# Patient Record
Sex: Female | Born: 1960 | Race: White | Hispanic: No | Marital: Married
Health system: Southern US, Community
[De-identification: ages and names within clinical notes are randomized; demographics above are authoritative.]

## PROBLEM LIST (undated history)

## (undated) DIAGNOSIS — Z1231 Encounter for screening mammogram for malignant neoplasm of breast: Secondary | ICD-10-CM

---

## 2008-01-13 ENCOUNTER — Observation Stay (HOSPITAL_COMMUNITY): Admission: RE | Admit: 2008-01-13 | Discharge: 2008-01-13 | Payer: Self-pay

## 2008-01-22 ENCOUNTER — Encounter: Admission: RE | Admit: 2008-01-22 | Discharge: 2008-01-22 | Payer: Self-pay | Admitting: Neurosurgery

## 2008-01-22 ENCOUNTER — Ambulatory Visit: Payer: Self-pay | Admitting: Cardiology

## 2008-01-22 ENCOUNTER — Inpatient Hospital Stay (HOSPITAL_COMMUNITY): Admission: EM | Admit: 2008-01-22 | Discharge: 2008-01-30 | Payer: Self-pay | Admitting: Emergency Medicine

## 2008-01-23 ENCOUNTER — Encounter (INDEPENDENT_AMBULATORY_CARE_PROVIDER_SITE_OTHER): Payer: Self-pay | Admitting: Neurosurgery

## 2008-03-03 ENCOUNTER — Encounter: Admission: RE | Admit: 2008-03-03 | Discharge: 2008-03-03 | Payer: Self-pay | Admitting: Neurosurgery

## 2008-04-15 ENCOUNTER — Ambulatory Visit (HOSPITAL_COMMUNITY): Admission: RE | Admit: 2008-04-15 | Discharge: 2008-04-15 | Payer: Self-pay | Admitting: Neurology

## 2010-07-10 ENCOUNTER — Encounter: Payer: Self-pay | Admitting: Cardiology

## 2010-07-10 ENCOUNTER — Ambulatory Visit (HOSPITAL_COMMUNITY)
Admission: RE | Admit: 2010-07-10 | Discharge: 2010-07-10 | Payer: Self-pay | Source: Home / Self Care | Attending: Neurology | Admitting: Neurology

## 2010-07-10 ENCOUNTER — Ambulatory Visit: Payer: Self-pay

## 2010-07-10 ENCOUNTER — Encounter (INDEPENDENT_AMBULATORY_CARE_PROVIDER_SITE_OTHER): Payer: Self-pay | Admitting: Neurology

## 2010-07-10 DIAGNOSIS — R55 Syncope and collapse: Secondary | ICD-10-CM

## 2010-07-26 ENCOUNTER — Telehealth (INDEPENDENT_AMBULATORY_CARE_PROVIDER_SITE_OTHER): Payer: Self-pay | Admitting: *Deleted

## 2010-08-20 ENCOUNTER — Encounter: Payer: Self-pay | Admitting: Neurosurgery

## 2010-08-31 NOTE — Progress Notes (Signed)
  12 Lead faxed over to [facility] @ [phone] [facility]  July 26, 2010 3:44 PM

## 2010-08-31 NOTE — Assessment & Plan Note (Signed)
Summary: ekg.dx: syncope.{ gna }  Nurse Visit   Orders Added: 1)  EKG w/ Interpretation [93000]

## 2010-12-12 NOTE — Discharge Summary (Signed)
NAMEMarland Kitchen  Cynthia Montoya, Cynthia Montoya              ACCOUNT NO.:  [id number]   MEDICAL RECORD NO.:  [id number]          PATIENT TYPE:  INP   LOCATION:  3018                         FACILITY:  [facility]   PHYSICIAN:  Kathaleen Maser. Pool, M.D.    DATE OF BIRTH:  [birth year]   DATE OF ADMISSION:  01/22/2008  DATE OF DISCHARGE:  01/30/2008                               DISCHARGE SUMMARY   FINAL DIAGNOSES:  1. Status post C6, 7, 8 anterior cervical diskectomy and fusion with      allograft and plating.  2. 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  3. Fibromuscular dysplasia.   HISTORY OF PRESENT ILLNESS:  Cynthia Montoya is a 50 year old female who is  approximately 10 days status post C6, 7, 8, anterior cervical diskectomy  and fusion with allograft and plating.  Postoperatively, the patient had  been doing reasonably well until she has suffered an episode of  vomiting.  Shortly thereafter the patient had increased neck pain and  headache.  She noted the onset of visual changes.  The patient was found  to have a dense left homonymous hemianopia.  She had no other  neurological findings.  CT scanning demonstrated evidence of a right-  sided occipital lobe infarct in the posterior cerebral artery  distribution.  The patient is admitted for further workup.   HOSPITAL COURSE:  The patient was taken to the MRI scanner which did  confirm the presence of a right posterior cerebral artery infarct.  MRA  showed evidence of occlusion of a right vertebral artery at its origin.  Changes were suggestive of fibromuscular dysplasia.  Later cerebral  arteriography demonstrated consistent findings with fibromuscular  dysplasia as well.  The area of arterial dissection and occlusion was  adjacent to the origin of the vertebral artery and reasonably distant  from the operative bed and it was felt to be unlikely that these two  were anyway related.  We are thinking at  this point is that the patient  suffered a spontaneous dissection when she had her episodes of emesis.  The patient currently is doing well.  She was anticoagulated first with  heparin and transitioned on to Coumadin.  At this point, the patient is  tolerating the Coumadin well with a therapeutic level.  Plan is to keep  her on Coumadin for approximately 6 months.  From a standpoint of her  anterior cervical surgery, her neck is healing well.  She had no  evidence of bleeding complications while anticoagulated.  Her left upper  extremity symptoms remained greatly improved from preoperative state.  The patient will be discharged from the hospital.  She will follow up in  our office in 1 week.  She is being monitored closely in the Coumadin  Clinic.           ______________________________  Kathaleen Maser. Pool, M.D.     HAP/MEDQ  D:  02/24/2008  T:  02/25/2008  Job:  [id number]

## 2010-12-12 NOTE — Consult Note (Signed)
NAME:  Cynthia Montoya NO.:  [id number]   MEDICAL RECORD NO.:  [id number]          PATIENT TYPE:  INP   LOCATION:  3314                         FACILITY:  [facility]   PHYSICIAN:  Marlan Palau, M.D.  DATE OF BIRTH:  21-Feb-1961   DATE OF CONSULTATION:  DATE OF DISCHARGE:                                 CONSULTATION   HISTORY OF PRESENT ILLNESS:  Cynthia Montoya is a 50 year old right-  handed white female born Feb 29, 1961, with a history of diabetes  and has had recent cervical spine surgery that was done on January 13, 2008.  This patient has had problems that began about 6 days prior to  this admission when she noted onset of a mild right periorbital headache  and some visual alteration off to the left with spots in the vision and  some blurring of vision off to the left.  The patient continued to have  this visual changes over the last several days.  The patient took some  Vicodin last evening but had some nausea and vomiting, felt faint, it is  not clear she may have blacked out.  After the nausea and vomiting, the  patient noted some right arm pain and right head pain.  The patient also  noted that the vision off the left has changed dramatically.  The  patient was set up for a CT scan of the head today which showed evidence  of an evolving right posterior cerebral artery distribution stroke.  The  patient was brought in to the emergency room for an evaluation.  MRI of  the brain is pending at this time.   PAST MEDICAL HISTORY:  Significant for:  1. Status post cervical spine surgery that was done on January 13, 2008.  2. 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  3. Gallbladder resection.  4. Menstrual migraine in the past.  5. History of Bell palsy.  The patient does not remember which side.  6. Asthma.  7. Diabetes.   MEDICATIONS:  Birth control pills, Singulair, and Zocor 10 mg daily.  The patient has an allergy to morphine and penicillin, does not smoke,  drinks alcohol on occasion.   SOCIAL HISTORY:  The patient is married, lives in the [place] area.  The patient works as a Runner, broadcasting/film/video, has one child who is  alive and well, is married.   FAMILY MEDICAL HISTORY:  The mother is alive with coronary artery  disease status post surgery.  The patient's father died from an MI.  The  patient has two brothers and no sisters, brothers have hypertension.   REVIEW OF SYSTEMS:  Notable for no recent fevers or chills.  The patient  felt hot last evening, however, the patient does note the headache as  above, some neck pain, right arm pain.  Denies any shortness of breath,  chest pains, palpitations of the heart.  Denies any abdominal pain.  Did  have some nausea.  Denies problems controlling bowels or bladder.  Not  sure that she may have had a syncopal event last evening.  Denies any  numbness or weakness on the arms or legs.   PHYSICAL EXAMINATION:  VITALS:  Blood pressure 141/71, heart rate 71,  respiratory rate 18, temperature afebrile.  GENERAL:  This patient is a  fairly well-developed white female who is alert and cooperative at the  time of examination.  HEENT:  Head is atraumatic.  EYES:  Pupils are  equal, round, and reactive to light.  Disks are flat bilaterally.  Mild  ptosis is seen on the right, but again the pupils are symmetric.  NECK:  Supple.  No carotid bruits.  RESPIRATORY:  Clear.  CARDIOVASCULAR:  Reveals a regular rate and rhythm.  No obvious murmurs or rubs noted.  EXTREMITIES:  Without significant edema.  NEUROLOGIC:  Cranial nerves,  as above.  Facial symmetry is present.  The patient has good sensation  of face to pinprick, soft touch bilaterally.  Has good strength to  facial muscles, muscles to head turn, shoulder shrug bilaterally.  Speech is well enunciated not aphasic.  Visual fields show a dense left  homonymous visual field deficit,  but again extraocular movements are  full.  Motor testing reveals 5/5 strength in all fours.  Good symmetric  motor tone is noted throughout.  Sensory testing is intact to pinprick,  soft touch, and vibratory sensation throughout.  The patient has good  finger-nose-finger, heel-to-shin.  Gait was not tested.  Deep tendon  reflexes remain symmetric normal.  No evidence of extinction is noted.  No drift is seen in arms or legs.  NIH stroke scale score is 2.   CT of the head is as above.   IMPRESSION:  1. New onset of a right posterior cerebral artery distribution stroke      with a left homonymous visual field deficit.  2. Recent cervical spine surgery.   Laboratory values so far include a white count of 9.5, hemoglobin of  13.9, hematocrit 40.9, MCV 93.3, platelets of 389.  The rest of the  profile is not available.  The patient has had symptoms involving the  left homonymous visual field for 6 days prior to this admission.  Given  the recent cervical spine surgery, need to consider and rule out the  possibility of a right carotid dissection.  This patient has symmetric  temporal artery pulses, does not have a true Horner syndrome as the  pupils are symmetric but the patient does have ptosis of the right eye  but does have a history of a previous Bell palsy.  The patient's husband  noted problems with some slight ptosis of the right eye prior to the  cervical spine surgery.  We will pursue further workup at this point.  Patient is not a TPA candidate due to duration of deficit and history of  recent surgery.   PLAN:  1. MRI of the brain to be done.  2. MRI angiogram of the intracranial and extracranial vessels.  3. A  2-D echocardiogram.  4. Aspirin therapy for now, may consider IV heparin if the dissection      is present.  We will follow up the patient's clinical course while      in-house.      Marlan Palau, M.D.  Electronically Signed     CKW/MEDQ  D:  01/22/2008   T:  01/23/2008  Job:  [id number]   cc:   Cynthia Montoya, M.D.  Dr. Yetta Flock

## 2010-12-12 NOTE — Op Note (Signed)
NAME:  Cynthia Montoya, Cynthia Montoya              ACCOUNT NO.:  [id number]   MEDICAL RECORD NO.:  [id number]          Montoya TYPE:  INP   LOCATION:  3314                         FACILITY:  [facility]   PHYSICIAN:  Kathaleen Maser. Pool, M.D.    DATE OF BIRTH:  11-04-1960   DATE OF PROCEDURE:  DATE OF DISCHARGE:                               OPERATIVE REPORT   SERVICE:  Neurosurgery.   ADMITTING DIAGNOSIS:  Right posterior cerebral artery acute infarct  secondary to right vertebral artery dissection.   HISTORY OF PRESENT ILLNESS:  Cynthia Montoya is a 50 year old female who is  9 days status post C6-C7 anterior cervical diskectomy and fusion with  allograft contemplating.  Cynthia surgery itself was uncomplicated and  postoperatively Cynthia Montoya has done quite well.  Cynthia Montoya was discharged Cynthia  following morning without difficulty.  At Cynthia time of discharge, Cynthia Montoya was  having no significant neck pain.  Cynthia Montoya was having no upper extremity  symptoms.  Cynthia Montoya neurological exam was intact.  Approximately 3 days  postoperatively, Cynthia Montoya noted that Cynthia Montoya had a reaction after taking  a Vicodin pill for some discomfort.  Cynthia Montoya noted that Cynthia Montoya had symptoms of  lightheadedness and some feeling that Cynthia Montoya was having Cynthia Montoya vision fade in  and out in both eyes.  I discussed Cynthia situation with Cynthia Montoya over  Cynthia phone.  It seemed to be more medication related than anything else.  Cynthia Montoya had no other neurological symptoms at that time and Cynthia Montoya symptoms  cleared over Cynthia next couple of hours.  Following that, Cynthia Montoya  refrained from taking any more Vicodin and had no further episodes.  Cynthia Montoya  was feeling well up until approximately 5:00 p.m. on January 21, 2008.  At  that time, Cynthia Montoya began having some left lower extremity pain,  which also appeared to be unrelated to Cynthia Montoya surgery.  Cynthia Montoya tried  ibuprofen, rest, and positional changes without improvement.  At that  point, Cynthia Montoya decided to take another Vicodin for pain relief.  Cynthia Montoya  had a  later onset of nausea with subsequent vomiting.  Following Cynthia  vomiting, Cynthia Montoya had worsening neck pain with some pain into right  upper extremity with associated headaches, and now Cynthia Montoya also was having  some difficulty with vision off in Cynthia peripheral aspect of Cynthia Montoya left  visual field.  I discussed Cynthia situation with Cynthia Montoya over Cynthia phone  last night.  At that time, my feeling was that Cynthia Montoya was most  likely having a migrainous attack from some type of unusual migraine  variant brought on by Cynthia Vicodin.  Cynthia Montoya's neck pain and right  upper extremity symptoms subsided over Cynthia next hour and half.  Cynthia Montoya  still had some headache and still had some funny visual symptoms, but  overall when I talked to Cynthia Montoya's husband later on that night, Cynthia Montoya  seemed to be feeling better and I once again was attributing this due to  some delayed symptoms following a migraine variant attack.  Today, I saw  Cynthia Montoya in Cynthia office for a routine follow up.  Cynthia Montoya wound is healing well.  There is no swelling around Cynthia incision.  Cynthia Montoya upper extremity pain has  completely resolved and Cynthia Montoya strength and sensation in Cynthia Montoya upper  extremities is normal.  Cynthia Montoya is not currently having any lower extremity  pain, but Cynthia Montoya still stated that Cynthia left leg feels a little funny.  Cynthia Montoya  main complaint was once again that Cynthia Montoya is having trouble seeing  peripherally out of Cynthia Montoya left eye.  On direct visual field testing, it  became apparent that Cynthia Montoya had rather dramatic left visual field deficit.  Cynthia Montoya extraocular movements were full and Cynthia Montoya had no other evidence of  cranial neuropathy or other problem.  I took Cynthia Montoya downstairs from our  office to get an emergent head CT scan.  This demonstrated edema along  Cynthia medial occipital lobe consistent with a distal posterior cerebral  artery infarction.  I informed Cynthia Montoya of this and we brought Cynthia Montoya  over to Cynthia emergency room for further workup.  Workup at that time   demonstrated a vertebral artery dissection on Cynthia right side at Cynthia  origin of Cynthia vertebral artery.  Cynthia Montoya had an MRI scan of Cynthia Montoya brain, which  demonstrates a right-sided posterior cerebral artery distribution  infarct, which is localized to Cynthia medial occipital lobe surface.  Cynthia Montoya  has no evidence of brainstem infarction or other problems.  Cynthia Montoya does  have some reconstitution of flow versus retrograde flow in Cynthia right  vertebral artery.  Flow into Cynthia left vertebral artery is normal.  Flow  into both PICA arteries are normal bilaterally.   CURRENT PHYSICAL EXAM:  Cynthia Montoya is awake and alert.  Cynthia Montoya is oriented and  appropriate.  Examination of Cynthia Montoya visual acuity finds Cynthia Montoya visual acuity  to be intact bilaterally.  Visual field shows a dense left visual field  cut.  Extraocular movements were full bilaterally.  Facial movement and  sensation is normal bilaterally.  Tongue protrudes in Cynthia midline.  Palate elevates Cynthia midline.  Shoulder shrug equally.  Motor and sensory  examination of Cynthia extremities is normal.  Deep tendon reflexes are  normoactive.  There was also long-tract signs.  Cynthia Montoya gait and Cynthia Montoya posture  is normal.  Cynthia Montoya cervical wound is well healed.  There is no evidence of  swelling.  Cynthia Montoya chest is clear to auscultation.  Heart is regular rate  and rhythm without bruit.  There is no evidence of carotid bruit.  Cynthia Montoya  abdomen is benign.  Extremities are free of injury or deformity.   IMPRESSION:  Acute right posterior cerebral artery infarction secondary  to embolic event secondary to right vertebral artery dissection.  Plan  is for admission Cynthia hospital with anticoagulation via IV heparin with  close observation.  I have discussed Cynthia risks and benefits of  anticoagulating Cynthia Montoya.  This relatively close to Cynthia time of Cynthia Montoya  surgery.  They are aware of Cynthia risk of possible hemorrhage, but  understand Cynthia rationale behind proceeding with this therapy in hopes of  preventing further stroke.   I have also explained to Cynthia Montoya that  Cynthia Montoya is not a candidate now nor was Cynthia Montoya a candidate last night for any  type of thrombolytic therapy because of Cynthia Montoya recent surgery,  however.   With regard to cause of this dissection, it is certainly unclear.  Certainly, there appears to be a temporal relationship to Cynthia surgery as  there was some degree of geographic proximity to Cynthia surgical site.  Cynthia  things that do not make sense to me are;  1. Cynthia dissection itself is inferior and lateral to Cynthia operative site      and in a location were it is unlikely to have any type of presence      or retraction.  2. There was no evidence of any direct injury to Cynthia vertebral artery      during surgery either in Cynthia form of bleeding or direct      visualization.  3. Cynthia Montoya symptoms came on very late and seemed to be more temporal      related to Cynthia vomiting after taking Vicodin.  Certainly, this by      no means denies that this event is somehow related temporally to      Cynthia surgery, but in terms of any direct injury to Cynthia vertebral      artery, I cannot describe it or explain it.  I think it is possible      this is spontaneous dissection that it is coincidentally related to      Cynthia time of Cynthia Montoya surgery.  Certainly this is unusual, but Cynthia Montoya whole      case and presentation has been very unusual.           ______________________________  Kathaleen Maser. Pool, M.D.     HAP/MEDQ  D:  01/22/2008  T:  01/23/2008  Job:  [id number]

## 2010-12-12 NOTE — Op Note (Signed)
NAME:  Cynthia Montoya, Cynthia Montoya              ACCOUNT NO.:  [id number]   MEDICAL RECORD NO.:  [id number]          PATIENT TYPE:  OBV   LOCATION:  3533                         FACILITY:  [facility]   PHYSICIAN:  Kathaleen Maser. Pool, M.D.    DATE OF BIRTH:  1960/12/17   DATE OF PROCEDURE:  01/13/2008  DATE OF DISCHARGE:  01/13/2008                               OPERATIVE REPORT   SERVICE:  Neurosurgery.   PREOPERATIVE DIAGNOSES:  1. 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  2. Status post C5-C6 anterior cervical diskectomy and fusion with      allograft and anterior plating.   POSTOPERATIVE DIAGNOSES:  1. 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  2. Status post C5-C6 anterior cervical diskectomy and fusion with      allograft and anterior plating.   PROCEDURE NAME:  1. Re-exploration of C5-C6 anterior cervical fusion with removal of C5-      C6 anterior plate instrumentation.  2. C6-C7 anterior cervical diskectomy and fusion with allograft and      anterior plating.   SURGEON:  Kathaleen Maser. Pool, MD   ASSISTANT:  Reinaldo Meeker, MD   ANESTHESIA:  General endotracheal.   INDICATION:  Cynthia Montoya is a 50 year old female with history of  previous C5-C6 anterior cervical diskectomy and fusion.  She presents  now with neck and left upper extremity pain, paresthesias, and weakness  consistent with a left-sided C7 radiculopathy failing conservative  management.  Workup demonstrates evidence of a large left-sided C6-C7  disk herniation.  Fusion at C5-C6 appears solid.  We discussed the  options of management including possibility of undergoing a C6-C7  anterior cervical diskectomy and fusion with allograft and anterior  plating.  The patient is aware of the risks and benefits and wished to  proceed.  This will require re-exploration and removal of her C5-C6  instrumentation.   OPERATIVE NOTE:  The patient was brought to the operating room and  placed on the operating  table in supine position.  After an adequate  level of anesthesia was achieved, the patient was placed supine with  neck slightly extended up with Halter traction.  The patient's anterior  cervical region was prepped and draped sterilely.  A #10 blade was used  to make a curvilinear skin incision overlying the C6-C7 interspace.  This was carried down sharply to the platysma and then divided  vertically and dissection proceeded on the medial border of the  sternomastoid muscle and carotid sheath.  Trachea and esophagus were  mobilized tracked towards the left.  Prevertebral fascia stripped off  the anterior spinal column.  Longus colli muscles were then elevated  bilaterally using electrocautery.  Deep self-retainer was placed.  Intraoperative fluoroscopy was used and levels were confirmed.  The  previous plate instrumentation at C5-C6 was dissected free.  The Zephir  plate was disassembled and removed completely.  The fusion at C5-C6 was  inspected and explored and found to be solid.  Attention was then placed  back to C6-C7.  Disk space was then incised with a 15 blade in  rectangular fashion.  Wide disk space clean-out was achieved using  pituitary rongeurs, upbiting pituitary rongeurs, forward and backward,  and Carlen curettes, Kerrison rongeurs, high-speed drill.  Elements of  the disk were removed down towards the posterior annulus.  Microscope  was then brought into the field and used throughout the remainder of  diskectomy.  Remaining aspects of annulus and osteophytes were removed  using high-speed drill down to the level of the posterior longitudinal  ligament.  Posterior longitudinal ligament was then elevated and  resected in piecemeal fashion using Kerrison rongeurs.  The thecal sac  was identified.  A wide central decompression was then performed  undercutting the bodies of C6 and C7.  Decompression then proceeded out  to each neural foramen.  Wide anterior foraminotomies  were then  performed along the course exiting C7 nerve roots bilaterally.  Elements  of disk herniation were completely resected.  All evidence of any  compression was completely eradicated.  At this point, a very thorough  decompression was then achieved.  There is no other injury to the thecal  sac or nerve roots.  A 5-mm LifeNet allograft wedge was then packed into  place and recessed less than roughly 1 mm from the anterior cortical  margin of C6 and C7.  A 25-mm Atlantis anterior cervical plate was then  placed over the C6 and C7 levels.  This was then attached under  fluoroscopic guidance using 30 mL variable screws to each of both  levels.  All 4 screws were given final tightening and found to be solid  within bone.  The locking screws were engaged in both levels.  Final  images revealed good position of the bone grafts and hardware with  proper operative level and normal alignment of spine.  Wound was then  irrigated with antibiotic solution.  Gelfoam was placed topically for  hemostasis which was found to be good.  Microscope and retractor systems  were removed.  Hemostasis was achieved with electrocautery.  Wounds were  closed in layers.  Steri-Strips and sterile dressing were applied.  There were no complications.  The patient tolerated the procedure well  and she returned to recovery room postoperative.           ______________________________  Kathaleen Maser. Pool, M.D.     HAP/MEDQ  D:  01/13/2008  T:  01/14/2008  Job:  [id number]

## 2011-04-26 LAB — HEMOGLOBIN A1C
Hgb A1c MFr Bld: 5
Mean Plasma Glucose: 101

## 2011-04-26 LAB — CBC
HCT: 41.8
HCT: 35.2 — ABNORMAL LOW
HCT: 37.3
HCT: 37.3
HCT: 38.5
HCT: 40.9
HCT: 37.8
HCT: 38
Hemoglobin: 14.2
Hemoglobin: 12.1
Hemoglobin: 12.2
Hemoglobin: 13.1
Hemoglobin: 13.1
Hemoglobin: 13.4
Hemoglobin: 12.3
Hemoglobin: 13.1
Hemoglobin: 13.3
Hemoglobin: 13.7
MCHC: 33.9
MCHC: 34.4
MCHC: 34.7
MCHC: 34.2
MCHC: 34.8
MCHC: 34.9
MCV: 93.1
MCV: 91.7
MCV: 91.9
MCV: 91.9
MCV: 91.9
MCV: 92.4
MCV: 93.3
MCV: 91.5
MCV: 92.2
Platelets: 319
Platelets: 343
Platelets: 389
RBC: 3.83 — ABNORMAL LOW
RBC: 3.86 — ABNORMAL LOW
RBC: 4.05
RBC: 4.06
RBC: 4.13
RBC: 4.17
RBC: 3.85 — ABNORMAL LOW
RBC: 4.13
RBC: 4.13
RBC: 4.35
RDW: 13
RDW: 13
WBC: 5.4
WBC: 6.1
WBC: 6.2
WBC: 6.8
WBC: 9.5
WBC: 4.6
WBC: 5.7
WBC: 6
WBC: 6.4

## 2011-04-26 LAB — BASIC METABOLIC PANEL
BUN: 9
BUN: 9
CO2: 26
CO2: 23
Chloride: 105
Glucose, Bld: 90
Glucose, Bld: 82
Potassium: 4.3
Potassium: 3.9
Sodium: 140

## 2011-04-26 LAB — DIFFERENTIAL
Basophils Absolute: 0
Basophils Relative: 0
Eosinophils Absolute: 0
Eosinophils Absolute: 0.2
Eosinophils Relative: 1
Eosinophils Relative: 2
Lymphs Abs: 2.3
Monocytes Absolute: 0.5

## 2011-04-26 LAB — LIPID PANEL
Cholesterol: 176
HDL: 43

## 2011-04-26 LAB — PROTIME-INR
INR: 1
INR: 1.1
INR: 1.3
INR: 3.6 — ABNORMAL HIGH
Prothrombin Time: 12.9
Prothrombin Time: 19.4 — ABNORMAL HIGH
Prothrombin Time: 33.4 — ABNORMAL HIGH

## 2011-04-26 LAB — ABO/RH: ABO/RH(D): O POS

## 2011-04-26 LAB — HEPARIN LEVEL (UNFRACTIONATED)
Heparin Unfractionated: 0.22 — ABNORMAL LOW
Heparin Unfractionated: 0.34
Heparin Unfractionated: 0.35
Heparin Unfractionated: <0.1 — ABNORMAL LOW

## 2011-04-26 LAB — HOMOCYSTEINE: Homocysteine: 6.7

## 2014-07-21 ENCOUNTER — Other Ambulatory Visit (HOSPITAL_BASED_OUTPATIENT_CLINIC_OR_DEPARTMENT_OTHER): Payer: Self-pay | Admitting: Rheumatology

## 2014-07-21 DIAGNOSIS — M545 Low back pain: Secondary | ICD-10-CM

## 2014-07-31 ENCOUNTER — Ambulatory Visit (HOSPITAL_BASED_OUTPATIENT_CLINIC_OR_DEPARTMENT_OTHER)
Admission: RE | Admit: 2014-07-31 | Discharge: 2014-07-31 | Disposition: A | Discharge status: Home / Self Care | Payer: BC Managed Care – PPO | Source: Ambulatory Visit | Attending: Rheumatology | Admitting: Rheumatology

## 2014-07-31 DIAGNOSIS — M4696 Unspecified inflammatory spondylopathy, lumbar region: Secondary | ICD-10-CM | POA: Diagnosis not present

## 2014-07-31 DIAGNOSIS — M545 Low back pain: Secondary | ICD-10-CM | POA: Insufficient documentation

## 2014-07-31 DIAGNOSIS — M79606 Pain in leg, unspecified: Secondary | ICD-10-CM | POA: Insufficient documentation

## 2014-07-31 DIAGNOSIS — M4697 Unspecified inflammatory spondylopathy, lumbosacral region: Secondary | ICD-10-CM | POA: Diagnosis not present

## 2016-10-15 IMAGING — MR MR LUMBAR SPINE W/O CM
4 of 5 series · 24 of 48 positions shown · non-contrast
Comparison: None.

CLINICAL DATA: Low back pain with bilateral leg pain and stiffness.

EXAM:
MRI LUMBAR SPINE WITHOUT CONTRAST
TECHNIQUE: Multiplanar, multisequence MR imaging of the lumbar spine was
performed. No intravenous contrast was administered.

[Series 2: T2 · sagittal · 4.0mm · 0.81mm/px · 7 of 16 slices shown (1 of 2)]
[im 1/16]
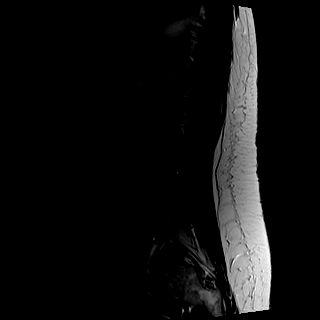
[im 3/16]
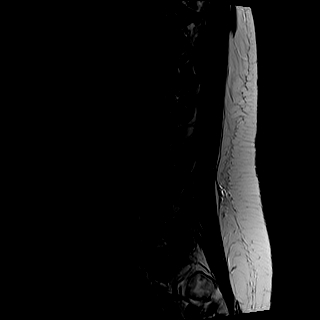
[im 6/16]
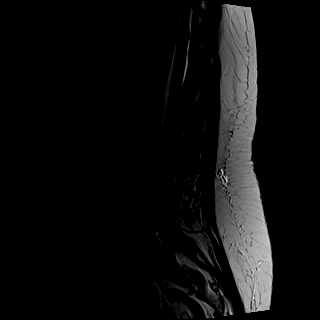
[im 8/16]
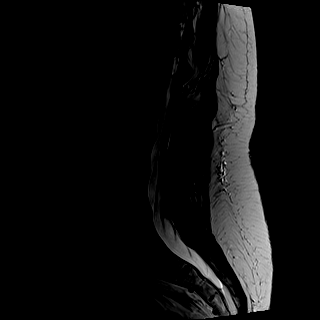
[im 11/16]
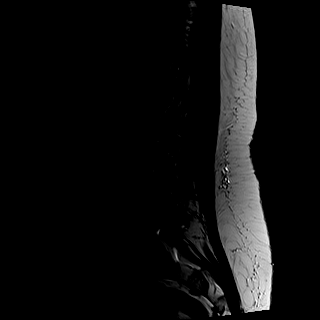
[im 13/16]
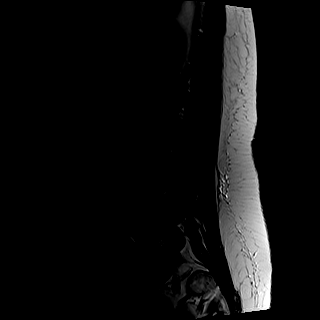
[im 16/16]
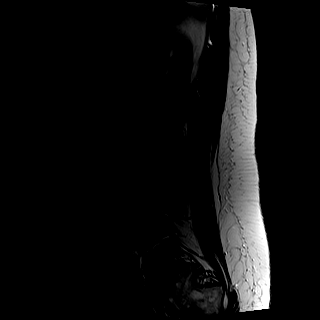

[Series 3: T1 · sagittal · 4.0mm · 0.41mm/px · 6 of 16 slices shown (1 of 2)]
[im 1/16]
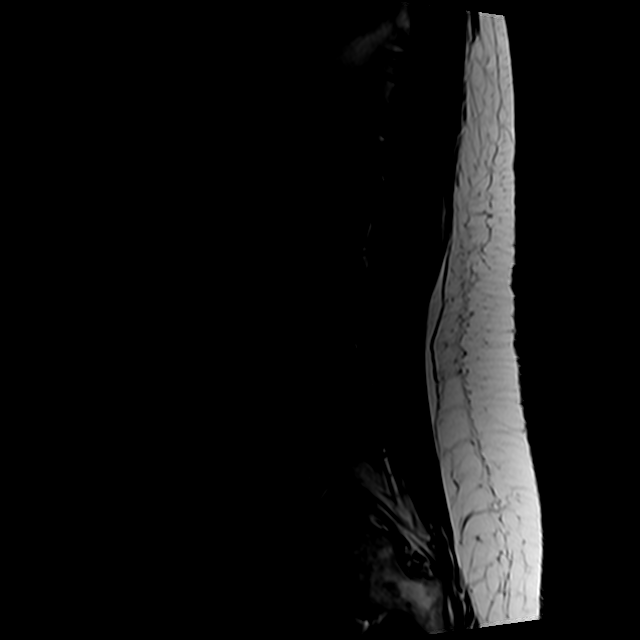
[im 3/16]
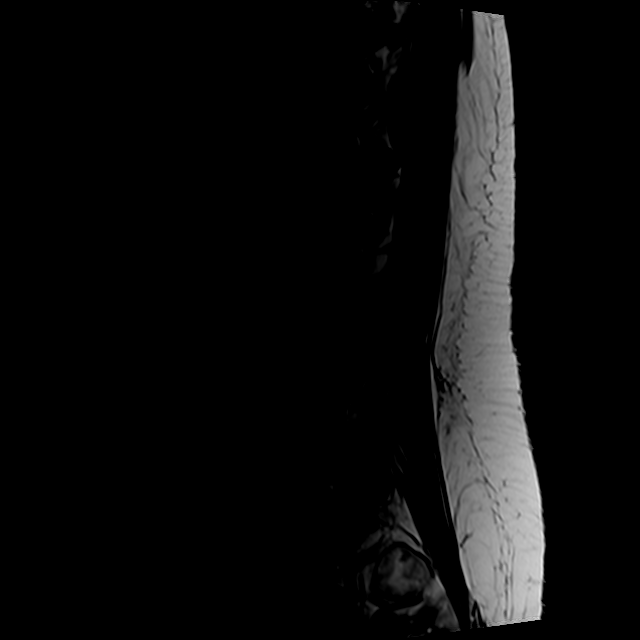
[im 6/16]
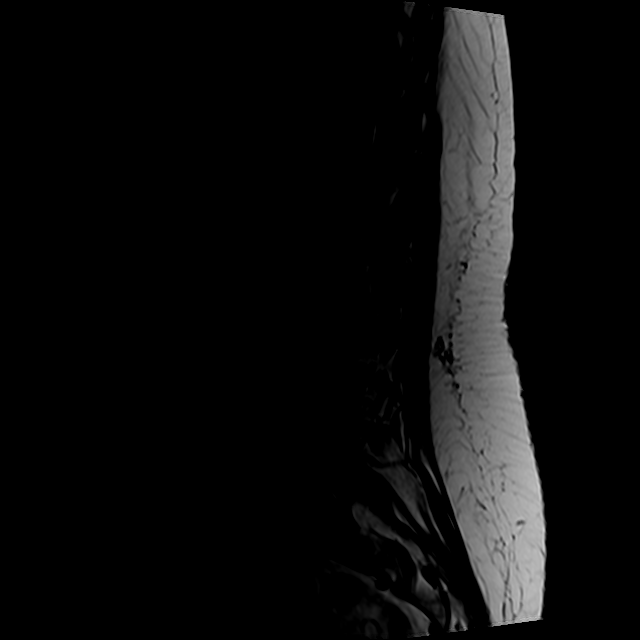
[im 8/16]
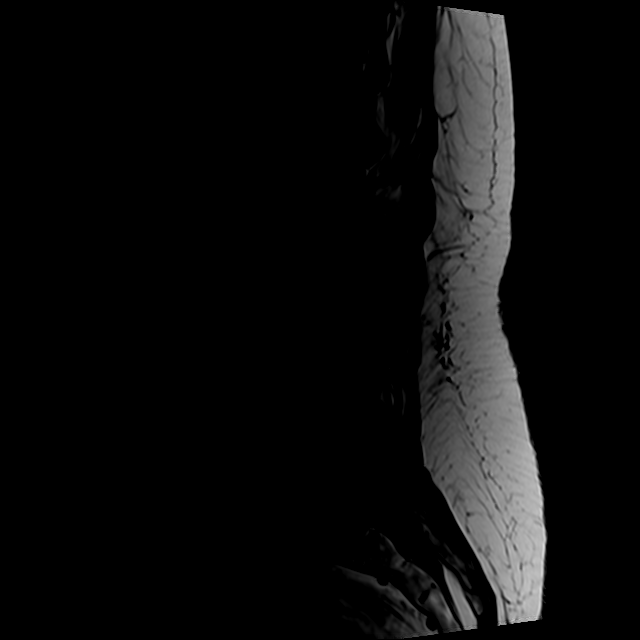
[im 11/16]
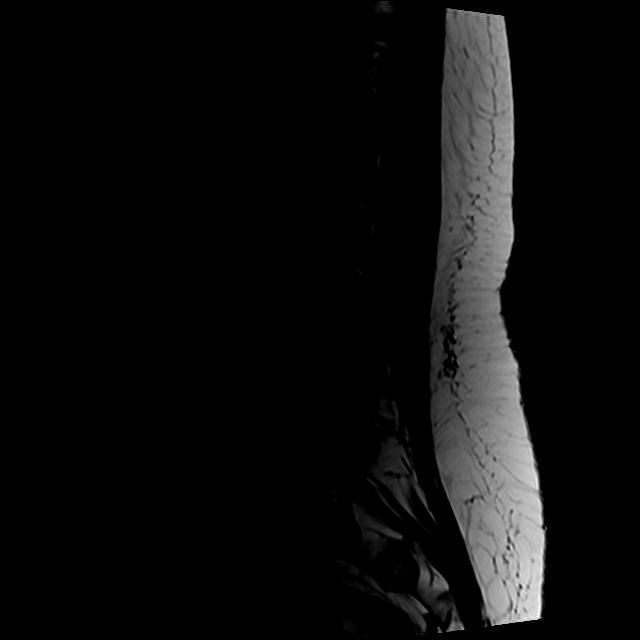
[im 13/16]
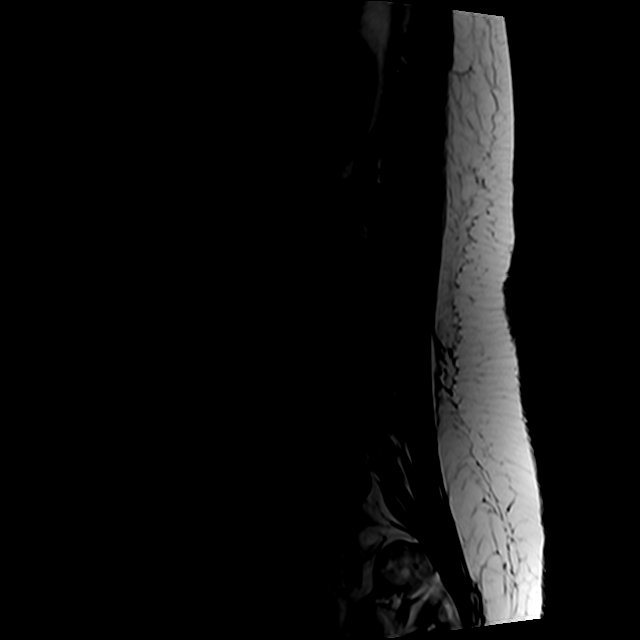

[Series 5: T2 · axial · 4.0mm · 0.78mm/px · z∈[-79,+117]mm · 8 of 36 slices shown (2 of 2)]
[im 1/36]
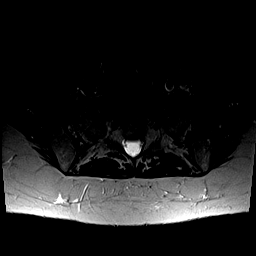
[im 6/36]
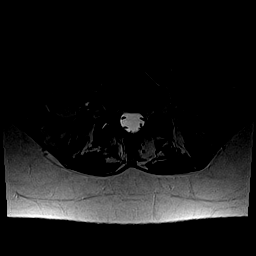
[im 11/36]
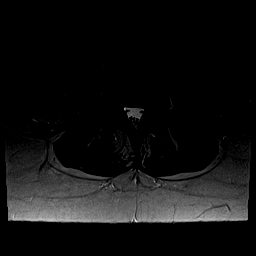
[im 17/36]
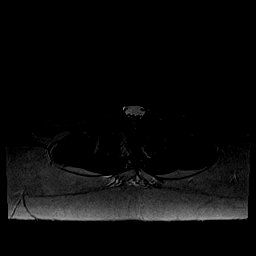
[im 19/36]
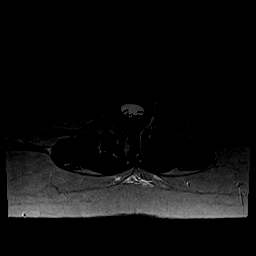
[im 25/36]
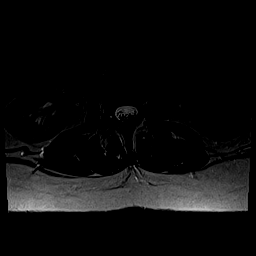
[im 30/36]
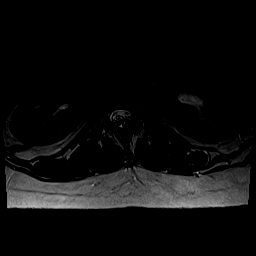
[im 36/36]
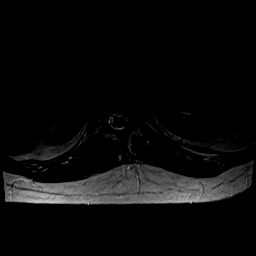

[Series 6: T1 · axial · 4.0mm · 0.31mm/px · z∈[-54,+87]mm · 3 of 36 slices shown (2 of 2)]
[im 6/36]
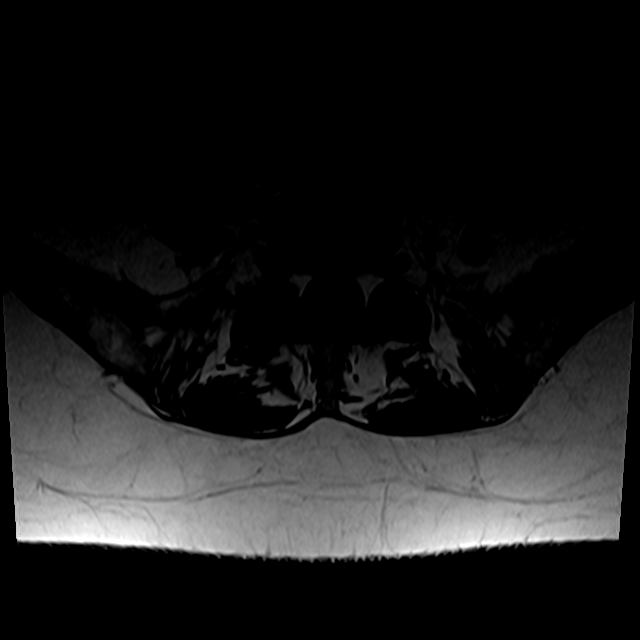
[im 19/36]
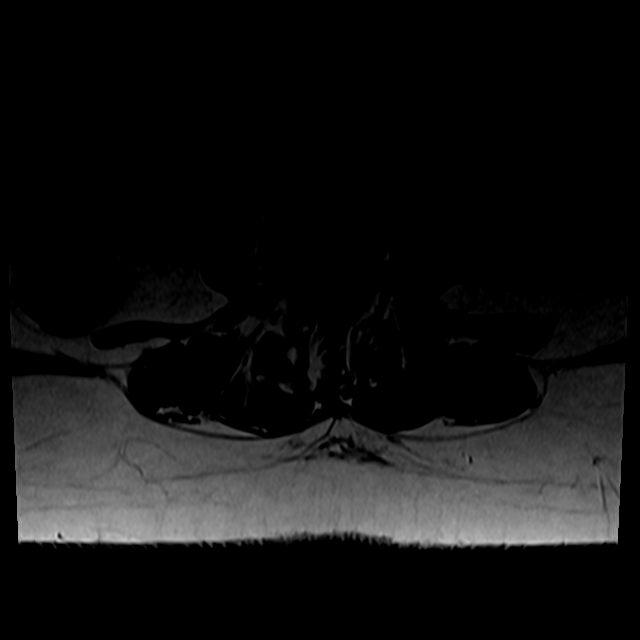
[im 30/36]
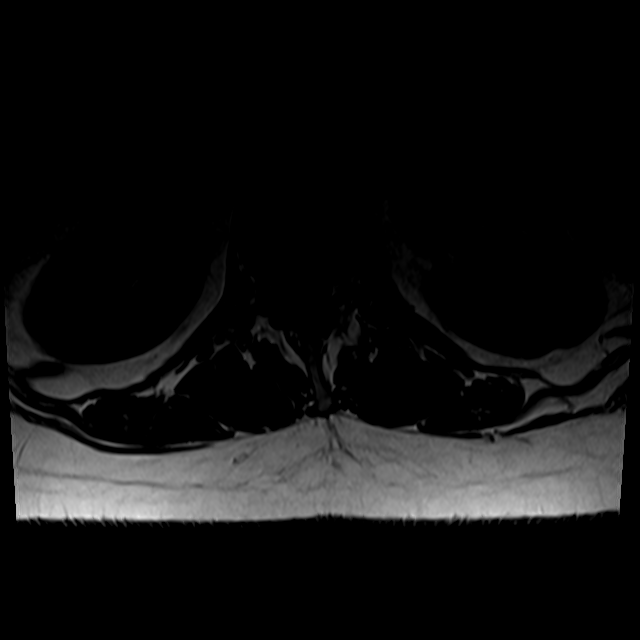

[24 of 48 positions shown; findings below may reference images not displayed]

FINDINGS: Normal appearing conus tip at L2. Normal paraspinal soft tissues.
Slight lumbar scoliosis.

T11-12 and T12-L1:  Normal.

L1-2: Small disc bulge to the left of midline with no neural
impingement.

L2-3:  Tiny broad-based disc bulge with no neural impingement.

L3-4:  Normal.

L4-5:  Normal disc.  Slight right and moderate left facet arthritis.

L5-S1: Normal disc. Moderately severe right facet arthritis. Normal
left facet joint.
IMPRESSION: 1. No significant degenerative disc disease in the lumbar spine.
2. Moderately severe right facet arthritis at L5-S1.
3. Moderate left facet arthritis at L4-5.

## 2017-01-21 ENCOUNTER — Telehealth (INDEPENDENT_AMBULATORY_CARE_PROVIDER_SITE_OTHER): Payer: Self-pay | Admitting: Rheumatology

## 2017-01-21 NOTE — Telephone Encounter (Signed)
[facility] [phone]

## 2021-08-29 ENCOUNTER — Encounter

## 2021-08-30 ENCOUNTER — Inpatient Hospital Stay: Admit: 2021-08-30 | Payer: PRIVATE HEALTH INSURANCE | Primary: Family Medicine

## 2021-08-30 DIAGNOSIS — Z1231 Encounter for screening mammogram for malignant neoplasm of breast: Secondary | ICD-10-CM
